# Patient Record
Sex: Female | Born: 1970 | Race: Black or African American | Hispanic: No | Marital: Married | Smoking: Former smoker
Health system: Southern US, Community
[De-identification: ages and names within clinical notes are randomized; demographics above are authoritative.]

## PROBLEM LIST (undated history)

## (undated) DIAGNOSIS — I10 Essential (primary) hypertension: Secondary | ICD-10-CM

## (undated) DIAGNOSIS — R002 Palpitations: Secondary | ICD-10-CM

---

## 1998-01-01 ENCOUNTER — Other Ambulatory Visit: Admission: RE | Admit: 1998-01-01 | Discharge: 1998-01-01 | Payer: Self-pay | Admitting: Obstetrics and Gynecology

## 1998-02-12 ENCOUNTER — Other Ambulatory Visit: Admission: RE | Admit: 1998-02-12 | Discharge: 1998-02-12 | Payer: Self-pay | Admitting: Obstetrics and Gynecology

## 1998-03-04 ENCOUNTER — Inpatient Hospital Stay (HOSPITAL_COMMUNITY): Admission: AD | Admit: 1998-03-04 | Discharge: 1998-03-04 | Payer: Self-pay | Admitting: Obstetrics and Gynecology

## 1998-03-23 ENCOUNTER — Inpatient Hospital Stay (HOSPITAL_COMMUNITY): Admission: AD | Admit: 1998-03-23 | Discharge: 1998-03-23 | Payer: Self-pay | Admitting: *Deleted

## 1998-03-24 ENCOUNTER — Inpatient Hospital Stay (HOSPITAL_COMMUNITY): Admission: AD | Admit: 1998-03-24 | Discharge: 1998-03-24 | Payer: Self-pay | Admitting: Obstetrics and Gynecology

## 1998-03-31 ENCOUNTER — Observation Stay (HOSPITAL_COMMUNITY): Admission: AD | Admit: 1998-03-31 | Discharge: 1998-03-31 | Payer: Self-pay | Admitting: Obstetrics and Gynecology

## 1998-03-31 ENCOUNTER — Inpatient Hospital Stay (HOSPITAL_COMMUNITY): Admission: AD | Admit: 1998-03-31 | Discharge: 1998-04-02 | Payer: Self-pay | Admitting: Obstetrics and Gynecology

## 1998-05-21 ENCOUNTER — Other Ambulatory Visit: Admission: RE | Admit: 1998-05-21 | Discharge: 1998-05-21 | Payer: Self-pay | Admitting: Obstetrics and Gynecology

## 2007-05-26 ENCOUNTER — Emergency Department (HOSPITAL_COMMUNITY): Admission: EM | Admit: 2007-05-26 | Discharge: 2007-05-26 | Payer: Self-pay | Admitting: Emergency Medicine

## 2010-01-02 ENCOUNTER — Emergency Department (HOSPITAL_COMMUNITY): Admission: EM | Admit: 2010-01-02 | Discharge: 2010-01-02 | Payer: Self-pay | Admitting: Emergency Medicine

## 2010-01-03 ENCOUNTER — Emergency Department (HOSPITAL_COMMUNITY): Admission: EM | Admit: 2010-01-03 | Discharge: 2010-01-03 | Payer: Self-pay | Admitting: Family Medicine

## 2010-07-15 ENCOUNTER — Emergency Department (HOSPITAL_COMMUNITY)
Admission: EM | Admit: 2010-07-15 | Discharge: 2010-07-16 | Payer: Self-pay | Source: Home / Self Care | Admitting: Emergency Medicine

## 2010-10-28 LAB — BASIC METABOLIC PANEL
Calcium: 9.4 mg/dL (ref 8.4–10.5)
GFR calc Af Amer: >60 mL/min (ref >60)
GFR calc non Af Amer: >60 mL/min (ref >60)
Glucose, Bld: 81 mg/dL (ref 70–99)
Potassium: 3.8 mEq/L (ref 3.5–5.1)
Sodium: 136 mEq/L (ref 135–145)

## 2010-10-28 LAB — POCT I-STAT, CHEM 8
BUN: 12 mg/dL (ref 6–23)
Hemoglobin: 12.9 g/dL (ref 12.0–15.0)
Potassium: 3.7 mEq/L (ref 3.5–5.1)
Sodium: 137 mEq/L (ref 135–145)
TCO2: 24 mmol/L (ref 0–100)

## 2010-10-28 LAB — CBC
HCT: 35 % — ABNORMAL LOW (ref 36.0–46.0)
Hemoglobin: 10.8 g/dL — ABNORMAL LOW (ref 12.0–15.0)
MCHC: 30.9 g/dL (ref 30.0–36.0)
RBC: 4.12 MIL/uL (ref 3.87–5.11)
WBC: 7.4 10*3/uL (ref 4.0–10.5)

## 2010-10-28 LAB — URINALYSIS, ROUTINE W REFLEX MICROSCOPIC
Glucose, UA: NEGATIVE mg/dL
Ketones, ur: NEGATIVE mg/dL
Nitrite: NEGATIVE
Specific Gravity, Urine: 1.009 (ref 1.005–1.030)
pH: 7 (ref 5.0–8.0)

## 2010-10-28 LAB — DIFFERENTIAL
Basophils Absolute: 0.1 10*3/uL (ref 0.0–0.1)
Basophils Relative: 1 % (ref 0–1)
Lymphocytes Relative: 26 % (ref 12–46)
Monocytes Absolute: 0.6 10*3/uL (ref 0.1–1.0)
Monocytes Relative: 8 % (ref 3–12)
Neutro Abs: 4.8 10*3/uL (ref 1.7–7.7)
Neutrophils Relative %: 65 % (ref 43–77)

## 2010-10-28 LAB — WET PREP, GENITAL
Clue Cells Wet Prep HPF POC: NONE SEEN
Trich, Wet Prep: NONE SEEN
Yeast Wet Prep HPF POC: NONE SEEN

## 2010-10-28 LAB — LACTIC ACID, PLASMA: Lactic Acid, Venous: 1.6 mmol/L (ref 0.5–2.2)

## 2010-10-28 LAB — POCT PREGNANCY, URINE: Preg Test, Ur: NEGATIVE

## 2010-11-03 LAB — COMPREHENSIVE METABOLIC PANEL
Albumin: 3.9 g/dL (ref 3.5–5.2)
BUN: 11 mg/dL (ref 6–23)
Calcium: 8.9 mg/dL (ref 8.4–10.5)
Creatinine, Ser: 0.74 mg/dL (ref 0.4–1.2)
Total Bilirubin: 1.1 mg/dL (ref 0.3–1.2)
Total Protein: 6.7 g/dL (ref 6.0–8.3)

## 2010-11-03 LAB — TSH: TSH: 0.511 u[IU]/mL (ref 0.350–4.500)

## 2010-11-03 LAB — DIFFERENTIAL
Basophils Absolute: 0.1 10*3/uL (ref 0.0–0.1)
Basophils Relative: 2 % — ABNORMAL HIGH (ref 0–1)
Eosinophils Absolute: 0 10*3/uL (ref 0.0–0.7)
Eosinophils Relative: 1 % (ref 0–5)
Monocytes Absolute: 0.4 10*3/uL (ref 0.1–1.0)

## 2010-11-03 LAB — CBC
HCT: 32.1 % — ABNORMAL LOW (ref 36.0–46.0)
Hemoglobin: 11 g/dL — ABNORMAL LOW (ref 12.0–15.0)
MCHC: 34.1 g/dL (ref 30.0–36.0)
Platelets: 229 10*3/uL (ref 150–400)
RDW: 13.6 % (ref 11.5–15.5)

## 2010-11-03 LAB — PROTIME-INR: INR: 1.05 (ref 0.00–1.49)

## 2010-11-03 LAB — APTT: aPTT: 32 seconds (ref 24–37)

## 2011-03-31 ENCOUNTER — Inpatient Hospital Stay (INDEPENDENT_AMBULATORY_CARE_PROVIDER_SITE_OTHER)
Admission: RE | Admit: 2011-03-31 | Discharge: 2011-03-31 | Disposition: A | Discharge status: Home / Self Care | Payer: Self-pay | Source: Ambulatory Visit | Attending: Family Medicine | Admitting: Family Medicine

## 2011-03-31 DIAGNOSIS — N760 Acute vaginitis: Secondary | ICD-10-CM

## 2011-03-31 LAB — POCT URINALYSIS DIP (DEVICE)
Bilirubin Urine: NEGATIVE
Glucose, UA: NEGATIVE mg/dL
Leukocytes, UA: NEGATIVE
Nitrite: POSITIVE — AB
pH: 7.5 (ref 5.0–8.0)

## 2011-03-31 LAB — WET PREP, GENITAL: Trich, Wet Prep: NONE SEEN

## 2011-04-02 LAB — URINE CULTURE: Culture  Setup Time: 201208141617

## 2011-04-02 LAB — GC/CHLAMYDIA PROBE AMP, GENITAL: Chlamydia, DNA Probe: NEGATIVE

## 2011-05-28 LAB — I-STAT 8, (EC8 V) (CONVERTED LAB)
Acid-Base Excess: 1
BUN: 9
Bicarbonate: 26.5 — ABNORMAL HIGH
HCT: 37
Hemoglobin: 12.6
Operator id: 198171
Sodium: 137
TCO2: 28
pCO2, Ven: 45.9

## 2011-05-28 LAB — URINALYSIS, ROUTINE W REFLEX MICROSCOPIC
Glucose, UA: NEGATIVE
Ketones, ur: NEGATIVE
Nitrite: NEGATIVE
Protein, ur: NEGATIVE
Urobilinogen, UA: 0.2

## 2011-05-28 LAB — POCT CARDIAC MARKERS
CKMB, poc: <1 — ABNORMAL LOW
Troponin i, poc: <0.05

## 2011-05-28 LAB — POCT PREGNANCY, URINE
Operator id: 198171
Preg Test, Ur: NEGATIVE

## 2011-11-19 ENCOUNTER — Encounter (HOSPITAL_COMMUNITY): Payer: Self-pay

## 2011-11-19 ENCOUNTER — Emergency Department (HOSPITAL_COMMUNITY)
Admission: EM | Admit: 2011-11-19 | Discharge: 2011-11-19 | Disposition: A | Discharge status: Home / Self Care | Payer: Self-pay | Attending: Emergency Medicine | Admitting: Emergency Medicine

## 2011-11-19 ENCOUNTER — Emergency Department (HOSPITAL_COMMUNITY): Payer: Self-pay

## 2011-11-19 DIAGNOSIS — N63 Unspecified lump in unspecified breast: Secondary | ICD-10-CM | POA: Insufficient documentation

## 2011-11-19 DIAGNOSIS — R079 Chest pain, unspecified: Secondary | ICD-10-CM | POA: Insufficient documentation

## 2011-11-19 LAB — CBC
HCT: 31.8 % — ABNORMAL LOW (ref 36.0–46.0)
MCHC: 31.4 g/dL (ref 30.0–36.0)
Platelets: 281 10*3/uL (ref 150–400)
RDW: 14.2 % (ref 11.5–15.5)
WBC: 4.6 10*3/uL (ref 4.0–10.5)

## 2011-11-19 LAB — BASIC METABOLIC PANEL
BUN: 15 mg/dL (ref 6–23)
Creatinine, Ser: 0.7 mg/dL (ref 0.50–1.10)
GFR calc Af Amer: >90 mL/min (ref >90)
GFR calc non Af Amer: >90 mL/min (ref >90)
Potassium: 3.7 mEq/L (ref 3.5–5.1)

## 2011-11-19 MED ORDER — OXYCODONE-ACETAMINOPHEN 5-325 MG PO TABS
1.0000 | ORAL_TABLET | Freq: Once | ORAL | Status: DC
  Filled 2011-11-19: qty 1

## 2011-11-19 NOTE — ED Notes (Signed)
Patient states that she had onset of CP last night while laying in bed. Pt states that pain improved on its own.  Pt states that today she had another episode of CP.  Pt states that CP comes and goes at this time.  Pt denies any Cp at this time.  Pt denies any SOB.  Pt did have an episode of nausea with same since her arrival to ED. Pt denies SOB.  PT denies anything that increases pain at this time.  Lungs are clear.  Patient does state that she had had a cough for the past several weeks. Cough is non productive at this time.

## 2011-11-19 NOTE — Discharge Instructions (Signed)
Chest Pain (Nonspecific) It is often hard to give a specific diagnosis for the cause of chest pain. There is always a chance that your pain could be related to something serious, such as a heart attack or a blood clot in the lungs. You need to follow up with your caregiver for further evaluation. CAUSES   Heartburn.   Pneumonia or bronchitis.   Anxiety or stress.   Inflammation around your heart (pericarditis) or lung (pleuritis or pleurisy).   A blood clot in the lung.   A collapsed lung (pneumothorax). It can develop suddenly on its own (spontaneous pneumothorax) or from injury (trauma) to the chest.   Shingles infection (herpes zoster virus).  The chest wall is composed of bones, muscles, and cartilage. Any of these can be the source of the pain.  The bones can be bruised by injury.   The muscles or cartilage can be strained by coughing or overwork.   The cartilage can be affected by inflammation and become sore (costochondritis).  DIAGNOSIS  Lab tests or other studies, such as X-rays, electrocardiography, stress testing, or cardiac imaging, may be needed to find the cause of your pain.  TREATMENT   Treatment depends on what may be causing your chest pain. Treatment may include:   Acid blockers for heartburn.   Anti-inflammatory medicine.   Pain medicine for inflammatory conditions.   Antibiotics if an infection is present.   You may be advised to change lifestyle habits. This includes stopping smoking and avoiding alcohol, caffeine, and chocolate.   You may be advised to keep your head raised (elevated) when sleeping. This reduces the chance of acid going backward from your stomach into your esophagus.   Most of the time, nonspecific chest pain will improve within 2 to 3 days with rest and mild pain medicine.  HOME CARE INSTRUCTIONS   If antibiotics were prescribed, take your antibiotics as directed. Finish them even if you start to feel better.   For the next few  days, avoid physical activities that bring on chest pain. Continue physical activities as directed.   Do not smoke.   Avoid drinking alcohol.   Only take over-the-counter or prescription medicine for pain, discomfort, or fever as directed by your caregiver.   Follow your caregiver's suggestions for further testing if your chest pain does not go away.   Keep any follow-up appointments you made. If you do not go to an appointment, you could develop lasting (chronic) problems with pain. If there is any problem keeping an appointment, you must call to reschedule.  SEEK MEDICAL CARE IF:   You think you are having problems from the medicine you are taking. Read your medicine instructions carefully.   Your chest pain does not go away, even after treatment.   You develop a rash with blisters on your chest.  SEEK IMMEDIATE MEDICAL CARE IF:   You have increased chest pain or pain that spreads to your arm, neck, jaw, back, or abdomen.   You develop shortness of breath, an increasing cough, or you are coughing up blood.   You have severe back or abdominal pain, feel nauseous, or vomit.   You develop severe weakness, fainting, or chills.   You have a fever.  THIS IS AN EMERGENCY. Do not wait to see if the pain will go away. Get medical help at once. Call your local emergency services (911 in [place] Do not drive yourself to the hospital. MAKE SURE YOU:   Understand these instructions.     Will watch your condition.   Will get help right away if you are not doing well or get worse.  Document Released: 05/13/2005 Document Revised: 07/23/2011 Document Reviewed: 03/08/2008 [employer] Patient Information 2012 [employer]. 

## 2011-11-19 NOTE — ED Notes (Signed)
Pt presents with L sided chest pain that began last night.  Pt reports she was lying on her L side, changed position that relieved pain somewhat to where she could go to sleep.  Pt reports pain began again while she was at work.  Pt denies any shortness of breath, nausea, but reports 2 day h/o migraine headache.

## 2011-11-20 NOTE — ED Provider Notes (Signed)
History     CSN: [id number]  Arrival date & time 11/19/11  1220   First MD Initiated Contact with Patient 11/19/11 1330      Chief Complaint  Patient presents with  . Chest Pain     HPI Pt reports development of left sided chest pain without n/v, SOB or diaphoresis. Worse when lying on her left side. Improved by movement. No unilateral leg swelling. No hx of DVT or PE. No hx of CAD or ACS. Also noted a new left sided breast mass last night. No skin changes or nipple dischargeNo weight loss. No hx of cancer. Occasional HA's including now. No unilateral arm or leg weakness. symtoms are mild severity  History reviewed. No pertinent past medical history.  History reviewed. No pertinent past surgical history.  Family Hx: no early cardiac dz, otherwise noncontributory  History  Substance Use Topics  . Smoking status: Never Smoker   . Smokeless tobacco: Not on file  . Alcohol Use: Yes    OB History    Grav Para Term Preterm Abortions TAB SAB Ect Mult Living                  Review of Systems  All other systems reviewed and are negative.    Allergies  Review of patient's allergies indicates no known allergies.  Home Medications   Current Outpatient Rx  Name Route Sig Dispense Refill  . THERA M PLUS PO TABS Oral Take 1 tablet by mouth daily.    Marland Kitchen PYRIDOXINE HCL 25 MG PO TABS Oral Take 25 mg by mouth daily.    Marland Kitchen VITAMIN C 500 MG PO TABS Oral Take 500 mg by mouth daily.      BP 133/86  Pulse 76  Temp(Src) 98.6 F (37 C) (Oral)  Resp 17  SpO2 99%  Physical Exam  Nursing note and vitals reviewed. Constitutional: She is oriented to person, place, and time. She appears well-developed and well-nourished. No distress.  HENT:  Head: Normocephalic and atraumatic.  Eyes: EOM are normal.  Neck: Normal range of motion.  Cardiovascular: Normal rate, regular rhythm and normal heart sounds.   Pulmonary/Chest: Effort normal and breath sounds normal.       Mild tenderness of  left anterior chest wall. Right breast normal in all 4 quadrants. Left breast with mild deep linear mass noted in right upper quadrant of breast. Normal nipple without discharge. No overlying skin changes. (chaperone present)  Abdominal: Soft. She exhibits no distension. There is no tenderness.  Musculoskeletal: Normal range of motion.  Neurological: She is alert and oriented to person, place, and time.  Skin: Skin is warm and dry.  Psychiatric: She has a normal mood and affect. Judgment normal.    ED Course  Procedures (including critical care time)  Labs Reviewed  CBC - Abnormal; Notable for the following:    RBC 3.82 (*)    Hemoglobin 10.0 (*)    HCT 31.8 (*)    All other components within normal limits  BASIC METABOLIC PANEL - Abnormal; Notable for the following:    Sodium 134 (*)    All other components within normal limits   Dg Chest 2 View  11/19/2011  *RADIOLOGY REPORT*  Clinical Data: Lambert Mody left side chest pain today  CHEST - 2 VIEW  Comparison: 05/26/2007  Findings: Upper-normal size of cardiac silhouette. Mediastinal contours and pulmonary vascularity normal. Lungs clear. No pleural effusion or pneumothorax. No acute osseous findings.  IMPRESSION: No acute abnormalities.  Original Report Authenticated By: Lollie Marrow, M.D.    Date: 11/21/2011  Rate: 84  Rhythm: normal sinus rhythm  QRS Axis: normal  Intervals: normal  ST/T Wave abnormalities: normal  Conduction Disutrbances: none  Narrative Interpretation:   Old EKG Reviewed: No significant changes noted     1. Chest pain   2. Breast mass       MDM  Mild linear mass noted in left breast. Likely fibrocystic but has been referred to the breast center for further evaluation and imaging. Pt to make appointment and understands importance. Chest pain is musculoskeletal. Doubt PE. Doubt ACS. ecg and cxr normal        Lyanne Co, MD 11/21/11 1046

## 2012-03-17 ENCOUNTER — Encounter (HOSPITAL_COMMUNITY): Payer: Self-pay

## 2012-03-17 ENCOUNTER — Emergency Department (INDEPENDENT_AMBULATORY_CARE_PROVIDER_SITE_OTHER)
Admission: EM | Admit: 2012-03-17 | Discharge: 2012-03-17 | Disposition: A | Discharge status: Home / Self Care | Payer: Self-pay | Source: Home / Self Care | Attending: Emergency Medicine | Admitting: Emergency Medicine

## 2012-03-17 DIAGNOSIS — J069 Acute upper respiratory infection, unspecified: Secondary | ICD-10-CM

## 2012-03-17 MED ORDER — AZITHROMYCIN 250 MG PO TABS: ORAL_TABLET | ORAL | Status: AC

## 2012-03-17 MED ORDER — FEXOFENADINE-PSEUDOEPHED ER 60-120 MG PO TB12: 1.0000 | ORAL_TABLET | Freq: Two times a day (BID) | ORAL | Status: AC

## 2012-03-17 MED ORDER — BENZONATATE 200 MG PO CAPS: 200.0000 mg | ORAL_CAPSULE | Freq: Three times a day (TID) | ORAL | Status: AC | PRN

## 2012-03-17 NOTE — ED Notes (Signed)
Cough, congestion; yellow secretions

## 2012-03-17 NOTE — ED Provider Notes (Signed)
Chief Complaint  Patient presents with  . URI    History of Present Illness:   Betty Schmidt is a 41 year old female who has had a 2-3 day history of nasal congestion with clear to yellow rhinorrhea, sinus pressure, sneezing, cough productive yellow sputum, ear ache, scratchy throat, hoarseness, chills, and diarrhea. She denies any wheezing, chest tightness, shortness of breath, nausea, vomiting, or fever.  Review of Systems:  Other than noted above, the patient denies any of the following symptoms. Systemic:  No fever, chills, sweats, fatigue, myalgias, headache, or anorexia. Eye:  No redness, pain or drainage. ENT:  No earache, ear congestion, nasal congestion, sneezing, rhinorrhea, sinus pressure, sinus pain, post nasal drip, or sore throat. Lungs:  No cough, sputum production, wheezing, shortness of breath, or chest pain. GI:  No abdominal pain, nausea, vomiting, or diarrhea. Skin:  No rash or itching.  PMFSH:  Past medical history, family history, social history, meds, and allergies were reviewed.  Physical Exam:   Vital signs:  BP 130/87  Pulse 106  Temp 98.7 F (37.1 C) (Oral)  Resp 18  SpO2 100% General:  Alert, in no distress. Eye:  No conjunctival injection or drainage. Lids were normal. ENT:  TMs and canals were normal, without erythema or inflammation.  Nasal mucosa was congested, without drainage.  Mucous membranes were moist.  Pharynx was clear, without exudate or drainage.  There were no oral ulcerations or lesions. Neck:  Supple, no adenopathy, tenderness or mass. Lungs:  No respiratory distress.  Lungs were clear to auscultation, without wheezes, rales or rhonchi.  Breath sounds were clear and equal bilaterally. Lungs were resonant to percussion.  No egophony. Heart:  Regular rhythm, without gallops, murmers or rubs. Skin:  Clear, warm, and dry, without rash or lesions.  Assessment:  The encounter diagnosis was Viral upper respiratory infection.  Plan:   1.  The  following meds were prescribed:   New Prescriptions   AZITHROMYCIN (ZITHROMAX Z-PAK) 250 MG TABLET    Take as directed.   BENZONATATE (TESSALON) 200 MG CAPSULE    Take 1 capsule (200 mg total) by mouth 3 (three) times daily as needed for cough.   FEXOFENADINE-PSEUDOEPHEDRINE (ALLEGRA-D) 60-120 MG PER TABLET    Take 1 tablet by mouth every 12 (twelve) hours.   2.  The patient was instructed in symptomatic care and handouts were given. 3.  The patient was told to return if becoming worse in any way, if no better in 3 or 4 days, and given some red flag symptoms that would indicate earlier return.  The patient was told not to get the prescription for antibiotic filled unless her respiratory symptoms had persisted for more than 7 to 10 days.     Reuben Likes, MD 03/17/12 [phone]

## 2013-06-07 ENCOUNTER — Encounter (HOSPITAL_COMMUNITY): Payer: Self-pay | Admitting: Emergency Medicine

## 2013-06-07 ENCOUNTER — Emergency Department (HOSPITAL_COMMUNITY)
Admission: EM | Admit: 2013-06-07 | Discharge: 2013-06-07 | Disposition: A | Discharge status: Home / Self Care | Payer: Self-pay | Attending: Emergency Medicine | Admitting: Emergency Medicine

## 2013-06-07 DIAGNOSIS — I10 Essential (primary) hypertension: Secondary | ICD-10-CM | POA: Insufficient documentation

## 2013-06-07 DIAGNOSIS — R42 Dizziness and giddiness: Secondary | ICD-10-CM | POA: Insufficient documentation

## 2013-06-07 DIAGNOSIS — Z3202 Encounter for pregnancy test, result negative: Secondary | ICD-10-CM | POA: Insufficient documentation

## 2013-06-07 DIAGNOSIS — Z87891 Personal history of nicotine dependence: Secondary | ICD-10-CM | POA: Insufficient documentation

## 2013-06-07 HISTORY — DX: Palpitations: R00.2

## 2013-06-07 HISTORY — DX: Essential (primary) hypertension: I10

## 2013-06-07 LAB — CBC WITH DIFFERENTIAL/PLATELET
Basophils Absolute: 0.1 10*3/uL (ref 0.0–0.1)
Basophils Relative: 1 % (ref 0–1)
Hemoglobin: 10.5 g/dL — ABNORMAL LOW (ref 12.0–15.0)
Lymphocytes Relative: 34 % (ref 12–46)
MCHC: 32 g/dL (ref 30.0–36.0)
Neutro Abs: 2.1 10*3/uL (ref 1.7–7.7)
Neutrophils Relative %: 51 % (ref 43–77)
RDW: 14.8 % (ref 11.5–15.5)
WBC: 4 10*3/uL (ref 4.0–10.5)

## 2013-06-07 LAB — BASIC METABOLIC PANEL
Chloride: 101 mEq/L (ref 96–112)
GFR calc Af Amer: >90 mL/min (ref >90)
Potassium: 3.9 mEq/L (ref 3.5–5.1)

## 2013-06-07 LAB — PREGNANCY, URINE: Preg Test, Ur: NEGATIVE

## 2013-06-07 MED ORDER — MECLIZINE HCL 25 MG PO TABS: 25.0000 mg | ORAL_TABLET | Freq: Three times a day (TID) | ORAL | Status: AC | PRN

## 2013-06-07 NOTE — Progress Notes (Signed)
[facility] provided pt with a list of primary care resources and a [facility] Card application.  °

## 2013-06-07 NOTE — ED Notes (Signed)
C/o dizzy spells for one week, yesterday felt as if going to pass, balance unsteady, nausea, no vomiting

## 2013-06-07 NOTE — ED Provider Notes (Signed)
CSN: [id number]     Arrival date & time 06/07/13  1155 History   First MD Initiated Contact with Patient 06/07/13 1500     Chief Complaint  Patient presents with  . Dizziness    The history is provided by the patient.  Pt has felt dizzy for a couple of weeks.  She noticed it after her menstrual cycle occurred twice in the last month.  Yesterday the symptoms became worse.  No  room spinning but the room does rock.  She does feel lightheaded.    When she suddenly stands up she notices the symptoms more.  No vomiting.  No earache.  SHe has noticed some ringing in her ears off and on. No trouble with speech.  It has been pretty constant now.  She has had some recent sinus congestion.  Past Medical History  Diagnosis Date  . Hypertension   . Heart palpitations    History reviewed. No pertinent past surgical history. History reviewed. No pertinent family history. History  Substance Use Topics  . Smoking status: Former Games developer  . Smokeless tobacco: Not on file  . Alcohol Use: No   OB History   Grav Para Term Preterm Abortions TAB SAB Ect Mult Living                 Review of Systems  Constitutional: Negative for fever.  HENT: Negative for ear discharge and ear pain.   Gastrointestinal: Negative for blood in stool and anal bleeding.  Neurological: Positive for dizziness. Negative for syncope, speech difficulty, weakness, numbness and headaches.  All other systems reviewed and are negative.    Allergies  Oxycodone-acetaminophen  Home Medications   Current Outpatient Rx  Name  Route  Sig  Dispense  Refill  . ibuprofen (ADVIL,MOTRIN) 200 MG tablet   Oral   Take 400-800 mg by mouth every 6 (six) hours as needed for pain.         . meclizine (ANTIVERT) 25 MG tablet   Oral   Take 1 tablet (25 mg total) by mouth 3 (three) times daily as needed for dizziness or nausea.   30 tablet   0    BP 147/79  Pulse 69  Temp(Src) 98.1 F (36.7 C) (Oral)  Resp 14  SpO2 100% Physical  Exam  Nursing note and vitals reviewed. Constitutional: She is oriented to person, place, and time. She appears well-developed and well-nourished. No distress.  HENT:  Head: Normocephalic and atraumatic.  Right Ear: External ear normal.  Left Ear: External ear normal.  Mouth/Throat: Oropharynx is clear and moist.  Eyes: Conjunctivae are normal. Right eye exhibits no discharge. Left eye exhibits no discharge. No scleral icterus.  Neck: Neck supple. No tracheal deviation present.  Cardiovascular: Normal rate, regular rhythm and intact distal pulses.   Pulmonary/Chest: Effort normal and breath sounds normal. No stridor. No respiratory distress. She has no wheezes. She has no rales.  Abdominal: Soft. Bowel sounds are normal. She exhibits no distension. There is no tenderness. There is no rebound and no guarding.  Musculoskeletal: She exhibits no edema and no tenderness.  Neurological: She is alert and oriented to person, place, and time. She has normal strength. No cranial nerve deficit ( no gross defecits noted) or sensory deficit. She exhibits normal muscle tone. She displays no seizure activity. Coordination normal.  No pronator drift bilateral upper extrem, able to hold both legs off bed for 5 seconds, sensation intact in all extremities, no visual field cuts, no left  or right sided neglect  Skin: Skin is warm and dry. No rash noted.  Psychiatric: She has a normal mood and affect.    ED Course  Procedures (including critical care time) Labs Review Labs Reviewed  CBC WITH DIFFERENTIAL - Abnormal; Notable for the following:    Hemoglobin 10.5 (*)    HCT 32.8 (*)    All other components within normal limits  BASIC METABOLIC PANEL - Abnormal; Notable for the following:    Sodium 134 (*)    GFR calc non Af Amer 83 (*)    All other components within normal limits  PREGNANCY, URINE   Imaging Review No results found.  EKG Interpretation   None       MDM   1. Vertigo       Patient has a mild anemia but this is similar to laboratory tests performed a few years ago. I doubt this is related to her symptoms. The patient's symptoms are not classic for peripheral vertigo but her symptoms are rather mild. She does admit a component of recent sinus congestion and some complaints of wobbling. Patient has a normal neurologic exam. I doubt stroke or other serious neurologic etiology. She'll be discharged home on a trial of meclizine and I recommend follow up with her primary Dr. if the symptoms persist.   the patient understands to return for worsening symptoms difficulty with her balance or speech   Celene Kras, MD 06/07/13 [phone]

## 2014-12-11 ENCOUNTER — Encounter (HOSPITAL_COMMUNITY): Payer: Self-pay | Admitting: Emergency Medicine

## 2014-12-11 ENCOUNTER — Emergency Department (HOSPITAL_COMMUNITY)
Admission: EM | Admit: 2014-12-11 | Discharge: 2014-12-11 | Disposition: A | Discharge status: Home / Self Care | Payer: 59 | Source: Home / Self Care | Attending: Family Medicine | Admitting: Family Medicine

## 2014-12-11 DIAGNOSIS — N939 Abnormal uterine and vaginal bleeding, unspecified: Secondary | ICD-10-CM | POA: Diagnosis not present

## 2014-12-11 LAB — POCT I-STAT, CHEM 8
BUN: 15 mg/dL (ref 6–23)
CHLORIDE: 103 mmol/L (ref 96–112)
Calcium, Ion: 1.23 mmol/L (ref 1.12–1.23)
Creatinine, Ser: 0.8 mg/dL (ref 0.50–1.10)
Glucose, Bld: 89 mg/dL (ref 70–99)
HCT: 31 % — ABNORMAL LOW (ref 36.0–46.0)
Hemoglobin: 10.5 g/dL — ABNORMAL LOW (ref 12.0–15.0)
POTASSIUM: 3.7 mmol/L (ref 3.5–5.1)
Sodium: 140 mmol/L (ref 135–145)
TCO2: 23 mmol/L (ref 0–100)

## 2014-12-11 MED ORDER — NORGESTIMATE-ETH ESTRADIOL 0.25-35 MG-MCG PO TABS: 1.0000 | ORAL_TABLET | Freq: Every day | ORAL | Status: AC

## 2014-12-11 NOTE — ED Provider Notes (Signed)
Betty Schmidt is a 44 y.o. female who presents to Urgent Care today for heavy vaginal bleeding for the last 2 weeks. This is not typical for the patient. She is using multiple pads per day. No abdominal pain fevers chills nausea vomiting or diarrhea. No treatment tried yet. No lightheadedness or dizziness.   Past Medical History  Diagnosis Date  . Hypertension   . Heart palpitations    History reviewed. No pertinent past surgical history. History  Substance Use Topics  . Smoking status: Former Research scientist (life sciences)  . Smokeless tobacco: Not on file  . Alcohol Use: No   ROS as above Medications: No current facility-administered medications for this encounter.   Current Outpatient Prescriptions  Medication Sig Dispense Refill  . ibuprofen (ADVIL,MOTRIN) 200 MG tablet Take 400-800 mg by mouth every 6 (six) hours as needed for pain.    . meclizine (ANTIVERT) 25 MG tablet Take 1 tablet (25 mg total) by mouth 3 (three) times daily as needed for dizziness or nausea. 30 tablet 0  . norgestimate-ethinyl estradiol (SPRINTEC 28) 0.25-35 MG-MCG tablet Take 1 tablet by mouth daily. 1 Package 0   Allergies  Allergen Reactions  . Oxycodone-Acetaminophen     Vomiting      Exam:  BP 156/107 mmHg  Pulse 86  Temp(Src) 98.2 F (36.8 C) (Oral)  Resp 24  SpO2 100%  LMP 11/27/2014 (Exact Date) Gen: Well NAD HEENT: EOMI,  MMM Lungs: Normal work of breathing. CTABL Heart: RRR no MRG Abd: NABS, Soft. Nondistended, Nontender no masses palpated Exts: Brisk capillary refill, warm and well perfused.   Results for orders placed or performed during the hospital encounter of 12/11/14 (from the past 24 hour(s))  I-STAT, chem 8     Status: Abnormal   Collection Time: 12/11/14  7:47 PM  Result Value Ref Range   Sodium 140 135 - 145 mmol/L   Potassium 3.7 3.5 - 5.1 mmol/L   Chloride 103 96 - 112 mmol/L   BUN 15 6 - 23 mg/dL   Creatinine, Ser 0.80 0.50 - 1.10 mg/dL   Glucose, Bld 89 70 - 99 mg/dL   Calcium,  Ion 1.23 1.12 - 1.23 mmol/L   TCO2 23 0 - 100 mmol/L   Hemoglobin 10.5 (L) 12.0 - 15.0 g/dL   HCT 31.0 (L) 36.0 - 46.0 %   No results found.  Assessment and Plan: 44 y.o. female with heavy bleeding. Start Sprintec follow-up with OB/GYN. Patient will require a ultrasound.  Discussed warning signs or symptoms. Please see discharge instructions. Patient expresses understanding.     Gregor Hams, MD 12/11/14 2006

## 2014-12-11 NOTE — ED Notes (Signed)
Pt states she has been having an irregular period this month with excessive bleeding and clotting.  She states she starts to get cramps then she has the blood clots that she says "pushes her tampon out".

## 2014-12-11 NOTE — Discharge Instructions (Signed)
Thank you for coming in today. Start Sprintec birth control pill daily. Follow-up with OB/GYN ASAP.   Abnormal Uterine Bleeding Abnormal uterine bleeding can affect women at various stages in life, including teenagers, women in their reproductive years, pregnant women, and women who have reached menopause. Several kinds of uterine bleeding are considered abnormal, including:  Bleeding or spotting between periods.   Bleeding after sexual intercourse.   Bleeding that is heavier or more than normal.   Periods that last longer than usual.  Bleeding after menopause.  Many cases of abnormal uterine bleeding are minor and simple to treat, while others are more serious. Any type of abnormal bleeding should be evaluated by your health care provider. Treatment will depend on the cause of the bleeding. HOME CARE INSTRUCTIONS Monitor your condition for any changes. The following actions may help to alleviate any discomfort you are experiencing:  Avoid the use of tampons and douches as directed by your health care provider.  Change your pads frequently. You should get regular pelvic exams and Pap tests. Keep all follow-up appointments for diagnostic tests as directed by your health care provider.  SEEK MEDICAL CARE IF:   Your bleeding lasts more than 1 week.   You feel dizzy at times.  SEEK IMMEDIATE MEDICAL CARE IF:   You pass out.   You are changing pads every 15 to 30 minutes.   You have abdominal pain.  You have a fever.   You become sweaty or weak.   You are passing large blood clots from the vagina.   You start to feel nauseous and vomit. MAKE SURE YOU:   Understand these instructions.  Will watch your condition.  Will get help right away if you are not doing well or get worse. Document Released: 08/03/2005 Document Revised: 08/08/2013 Document Reviewed: 03/02/2013 [facility] Patient Information 2015 [place], [place]. This information is not intended to replace  advice given to you by your health care provider. Make sure you discuss any questions you have with your health care provider.

## 2017-03-31 DIAGNOSIS — K219 Gastro-esophageal reflux disease without esophagitis: Secondary | ICD-10-CM | POA: Insufficient documentation

## 2017-03-31 DIAGNOSIS — I10 Essential (primary) hypertension: Secondary | ICD-10-CM | POA: Insufficient documentation

## 2018-12-11 ENCOUNTER — Encounter (HOSPITAL_COMMUNITY): Payer: Self-pay | Admitting: *Deleted

## 2018-12-11 ENCOUNTER — Emergency Department (HOSPITAL_COMMUNITY)
Admission: EM | Admit: 2018-12-11 | Discharge: 2018-12-11 | Disposition: A | Discharge status: Home / Self Care | Payer: 59 | Attending: Emergency Medicine | Admitting: Emergency Medicine

## 2018-12-11 DIAGNOSIS — Z79899 Other long term (current) drug therapy: Secondary | ICD-10-CM | POA: Diagnosis not present

## 2018-12-11 DIAGNOSIS — Z202 Contact with and (suspected) exposure to infections with a predominantly sexual mode of transmission: Secondary | ICD-10-CM | POA: Insufficient documentation

## 2018-12-11 DIAGNOSIS — I10 Essential (primary) hypertension: Secondary | ICD-10-CM | POA: Insufficient documentation

## 2018-12-11 MED ORDER — AZITHROMYCIN 250 MG PO TABS
1000.0000 mg | ORAL_TABLET | Freq: Once | ORAL | Status: AC
  Administered 2018-12-11: 1000 mg via ORAL
  Filled 2018-12-11: qty 4

## 2018-12-11 MED ORDER — METRONIDAZOLE 500 MG PO TABS
2000.0000 mg | ORAL_TABLET | Freq: Once | ORAL | Status: AC
  Administered 2018-12-11: 2000 mg via ORAL
  Filled 2018-12-11: qty 4

## 2018-12-11 MED ORDER — LIDOCAINE HCL 1 % IJ SOLN
INTRAMUSCULAR | Status: AC
  Filled 2018-12-11: qty 20

## 2018-12-11 MED ORDER — CEFTRIAXONE SODIUM 250 MG IJ SOLR
250.0000 mg | Freq: Once | INTRAMUSCULAR | Status: AC
  Administered 2018-12-11: 250 mg via INTRAMUSCULAR
  Filled 2018-12-11: qty 250

## 2018-12-11 NOTE — Discharge Instructions (Addendum)
Refrain from unprotected sexual activity for the next 2 weeks to avoid reinfecting your partner.  Return to the emergency department if symptoms significantly worsen or change.

## 2018-12-11 NOTE — ED Triage Notes (Signed)
Pt reports her fiance has been having symptoms of STD and is concerned she may have STD. Pt denies symptoms at this time.

## 2018-12-11 NOTE — ED Notes (Signed)
Bed: [facility] Expected date:  Expected time:  Means of arrival:  Comments: 

## 2018-12-11 NOTE — ED Provider Notes (Signed)
[facility] DEPT Provider Note   CSN: [id number] Arrival date & time: 12/11/18  1313    History   Chief Complaint Chief Complaint  Patient presents with  . Exposure to STD    HPI Betty Schmidt is a 48 y.o. female.     Patient is a 48 year old female with history of hypertension.  She presents today for possible STD.  She states that her fianc has been having burning with urination for the past several weeks.  She is concerned he may have an STD.  Patient is not currently having any symptoms.  She denies any pelvic pain, dysuria, or discharge.  The history is provided by the patient.  Exposure to STD  This is a new problem. Nothing aggravates the symptoms. Nothing relieves the symptoms.    Past Medical History:  Diagnosis Date  . Heart palpitations   . Hypertension     There are no active problems to display for this patient.   History reviewed. No pertinent surgical history.   OB History   No obstetric history on file.      Home Medications    Prior to Admission medications   Medication Sig Start Date End Date Taking? Authorizing Provider  ibuprofen (ADVIL,MOTRIN) 200 MG tablet Take 400-800 mg by mouth every 6 (six) hours as needed for pain.    [provider]  meclizine (ANTIVERT) 25 MG tablet Take 1 tablet (25 mg total) by mouth 3 (three) times daily as needed for dizziness or nausea. 06/07/13   Dorie Rank, MD  norgestimate-ethinyl estradiol (Lazy Y U 28) 0.25-35 MG-MCG tablet Take 1 tablet by mouth daily. 12/11/14   Gregor Hams, MD    Family History No family history on file.  Social History Social History   Tobacco Use  . Smoking status: Former Research scientist (life sciences)  . Smokeless tobacco: Never Used  Substance Use Topics  . Alcohol use: No  . Drug use: No     Allergies   Oxycodone-acetaminophen   Review of Systems Review of Systems  All other systems reviewed and are negative.    Physical Exam Updated  Vital Signs BP (!) 145/96 (BP Location: Left Arm)   Pulse (!) 107   Temp 99 F (37.2 C) (Oral)   Resp 18   LMP 11/27/2018   SpO2 99%   Physical Exam Vitals signs and nursing note reviewed.  Constitutional:      Appearance: Normal appearance.  HENT:     Head: Normocephalic.  Pulmonary:     Effort: Pulmonary effort is normal.  Skin:    General: Skin is warm and dry.  Neurological:     Mental Status: She is alert.      ED Treatments / Results  Labs (all labs ordered are listed, but only abnormal results are displayed) Labs Reviewed - No data to display  EKG None  Radiology No results found.  Procedures Procedures (including critical care time)  Medications Ordered in ED Medications  cefTRIAXone (ROCEPHIN) injection 250 mg (has no administration in time range)  azithromycin (ZITHROMAX) tablet 1,000 mg (has no administration in time range)  metroNIDAZOLE (FLAGYL) tablet 2,000 mg (has no administration in time range)     Initial Impression / Assessment and Plan / ED Course  I have reviewed the triage vital signs and the nursing notes.  Pertinent labs & imaging results that were available during my care of the patient were reviewed by me and considered in my medical decision making (see chart for  details).  Presents with concerns of an STD.  After discussion, the decision was made to presumptively treat.  She will be given Rocephin, Zithromax, and Flagyl.  To return as needed for any problems.  Final Clinical Impressions(s) / ED Diagnoses   Final diagnoses:  None    ED Discharge Orders    None       Veryl Speak, MD 12/11/18 1355

## 2019-12-04 DIAGNOSIS — N6310 Unspecified lump in the right breast, unspecified quadrant: Secondary | ICD-10-CM | POA: Insufficient documentation

## 2019-12-04 DIAGNOSIS — K5900 Constipation, unspecified: Secondary | ICD-10-CM | POA: Insufficient documentation

## 2019-12-04 DIAGNOSIS — D219 Benign neoplasm of connective and other soft tissue, unspecified: Secondary | ICD-10-CM | POA: Insufficient documentation

## 2019-12-04 DIAGNOSIS — Z6833 Body mass index (BMI) 33.0-33.9, adult: Secondary | ICD-10-CM | POA: Insufficient documentation

## 2019-12-04 DIAGNOSIS — E6609 Other obesity due to excess calories: Secondary | ICD-10-CM | POA: Insufficient documentation

## 2020-10-23 DIAGNOSIS — E559 Vitamin D deficiency, unspecified: Secondary | ICD-10-CM | POA: Insufficient documentation

## 2021-01-17 ENCOUNTER — Institutional Professional Consult (permissible substitution): Payer: 59 | Admitting: Plastic Surgery

## 2021-05-15 ENCOUNTER — Encounter: Payer: Self-pay | Admitting: Podiatry

## 2021-05-15 ENCOUNTER — Other Ambulatory Visit: Payer: Self-pay

## 2021-05-15 ENCOUNTER — Ambulatory Visit (INDEPENDENT_AMBULATORY_CARE_PROVIDER_SITE_OTHER): Payer: Federal, State, Local not specified - PPO | Admitting: Podiatry

## 2021-05-15 ENCOUNTER — Ambulatory Visit (INDEPENDENT_AMBULATORY_CARE_PROVIDER_SITE_OTHER): Payer: Federal, State, Local not specified - PPO

## 2021-05-15 ENCOUNTER — Other Ambulatory Visit: Payer: Self-pay | Admitting: Podiatry

## 2021-05-15 DIAGNOSIS — M722 Plantar fascial fibromatosis: Secondary | ICD-10-CM

## 2021-05-15 DIAGNOSIS — M205X2 Other deformities of toe(s) (acquired), left foot: Secondary | ICD-10-CM

## 2021-05-15 DIAGNOSIS — M21279 Flexion deformity, unspecified ankle and toes: Secondary | ICD-10-CM | POA: Diagnosis not present

## 2021-05-15 MED ORDER — MELOXICAM 15 MG PO TABS: 15.0000 mg | ORAL_TABLET | Freq: Every day | ORAL | 3 refills | Status: AC

## 2021-05-15 NOTE — Progress Notes (Signed)
  Subjective:  Patient ID: Betty Schmidt, female    DOB: [birth year],  MRN: [id number]  Chief Complaint  Patient presents with   Foot Pain    Np  right foot heel pain-painful to walk/ right great toe-unable to bend    50 y.o. female presents with the above complaint. History confirmed with patient.  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  She has 2 main issues plantar right heel pain which is been going on severely since a few weeks ago when they had to wear small flat shoes for work instead of her usual sneakers as per the heel pain to start.  Its worse in the morning she steps down.  She also complains of stiffness and pain in the big toe joint on the left foot.  Objective:  Physical Exam: warm, good capillary refill, no trophic changes or ulcerative lesions, normal DP and PT pulses, and normal sensory exam. Left Foot: First met elevatus and forefoot varus present with hypermobility of the first ray, she has a palpable dorsal spur and limited range of motion of the first MTPJ with pain on palpation Right Foot: First met elevatus and forefoot varus present with hypermobility of first ray, she has pain on palpation of the plantar medial calcaneal tubercle  No images are attached to the encounter.  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 Plantar fasciitis of right foot      Plan:  Patient was evaluated and treated and all questions answered.  Discussed the etiology and treatment options for plantar fasciitis including stretching, formal physical therapy, supportive shoegears such as a running shoe or sneaker, pre fabricated orthoses, injection therapy, and oral medications. We also discussed the role of surgical treatment of this for patients who do not improve after exhausting non-surgical  treatment options.   -XR reviewed with patient -Educated patient on stretching and icing of the affected limb -Plantar fascial brace dispensed -Injection delivered to the plantar fascia of the right foot. -Rx for meloxicam. Educated on use, risks and benefits of the medication  After sterile prep with povidone-iodine solution and alcohol, the right heel was injected with 0.5cc 2% xylocaine plain, 0.5cc 0.5% marcaine plain, $RemoveBeforeD'5mg'XyoaGPaVjxDrux$  triamcinolone acetonide, and $RemoveBefore'2mg'MDPOndNpuQpOy$  dexamethasone was injected along the medial plantar fascia at the insertion on the plantar calcaneus. The patient tolerated the procedure well without complication.  Reviewed the radiographs and explained the pathomechanics of forefoot elevatus and compensated forefoot varus how this contributes to hallux limitus and limitation of the range of motion of the first MTPJ.  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  Discussed treatment for this her range of motion does improve with plantarflexion of the ray and I discussed custom molded orthoses which she was casted for today with a deep heel cup longitudinal arch support horseshoe heel pad for the plantar fasciitis and a first metatarsal cut out of the first ray into the shell to plantar flex the first ray hopefully this will give her some relief and avoid surgical intervention.  I do think she may be a good candidate for a cheilectomy.  Could consider a plantarflexion osteotomy as well if not improving.  No follow-ups on file.

## 2021-06-17 ENCOUNTER — Encounter: Payer: Self-pay | Admitting: Podiatry

## 2021-06-17 ENCOUNTER — Ambulatory Visit: Payer: Federal, State, Local not specified - PPO | Admitting: Podiatry

## 2021-06-17 ENCOUNTER — Other Ambulatory Visit: Payer: Self-pay

## 2021-06-17 DIAGNOSIS — M21279 Flexion deformity, unspecified ankle and toes: Secondary | ICD-10-CM | POA: Diagnosis not present

## 2021-06-17 DIAGNOSIS — M722 Plantar fascial fibromatosis: Secondary | ICD-10-CM | POA: Diagnosis not present

## 2021-06-17 DIAGNOSIS — M205X2 Other deformities of toe(s) (acquired), left foot: Secondary | ICD-10-CM

## 2021-06-17 NOTE — Progress Notes (Signed)
  Subjective:  Patient ID: Betty Schmidt, female    DOB: [birth year],  MRN: [id number]  Chief Complaint  Patient presents with   Plantar Fasciitis    Right foot, 1 month follow up,  puo(orthotics in)    50 y.o. female presents with the above complaint. History confirmed with patient.  Overall doing better, says she has had about 40% relief since the last injection.  We did not give her the exercises last visit so she has not been able to do them but has been doing her own stretching on her own.  Has been taking meloxicam daily.  Objective:  Physical Exam: warm, good capillary refill, no trophic changes or ulcerative lesions, normal DP and PT pulses, and normal sensory exam. 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 Plantar fasciitis of right foot   2. Hallux limitus of left foot   3. Dorsiflexed first ray, unspecified laterality      Plan:  Patient was evaluated and treated and all questions answered.  Discussed the etiology and treatment options for plantar fasciitis including stretching, formal physical therapy, supportive shoegears such as a running shoe or sneaker, pre fabricated orthoses, injection therapy, and oral medications. We also discussed the role of surgical treatment of this for patients who do not improve after exhausting non-surgical treatment options.   -XR reviewed with patient -Educated patient on stretching and icing of the affected limb.   Home exercise plan dispensed -Repeat injection next visit if still quite painful -Continue as needed meloxicam. Educated on use, risks and benefits of the medication   Orthotics were available today for dispensing and I assessed them for form fit and function and she was pleased with them.  I reviewed the break-in period hopefully this will alleviate her hallux limitus that is mostly functional.  Could consider cheilectomy of the dorsal spur if not improving on the left foot.  Return in about 6 weeks (around 07/29/2021).

## 2021-06-17 NOTE — Patient Instructions (Signed)

## 2021-06-22 ENCOUNTER — Encounter: Payer: Self-pay | Admitting: Podiatry

## 2021-06-23 NOTE — Telephone Encounter (Signed)
Please call.

## 2021-07-29 ENCOUNTER — Ambulatory Visit: Payer: Federal, State, Local not specified - PPO | Admitting: Podiatry

## 2021-09-10 ENCOUNTER — Other Ambulatory Visit: Payer: Self-pay

## 2021-09-10 ENCOUNTER — Emergency Department (HOSPITAL_BASED_OUTPATIENT_CLINIC_OR_DEPARTMENT_OTHER)
Admission: EM | Admit: 2021-09-10 | Discharge: 2021-09-10 | Disposition: A | Discharge status: Home / Self Care | Payer: Federal, State, Local not specified - PPO | Attending: Emergency Medicine | Admitting: Emergency Medicine

## 2021-09-10 ENCOUNTER — Emergency Department (HOSPITAL_BASED_OUTPATIENT_CLINIC_OR_DEPARTMENT_OTHER): Payer: Federal, State, Local not specified - PPO | Admitting: Radiology

## 2021-09-10 DIAGNOSIS — M25551 Pain in right hip: Secondary | ICD-10-CM | POA: Insufficient documentation

## 2021-09-10 DIAGNOSIS — M62838 Other muscle spasm: Secondary | ICD-10-CM | POA: Insufficient documentation

## 2021-09-10 DIAGNOSIS — R519 Headache, unspecified: Secondary | ICD-10-CM | POA: Diagnosis not present

## 2021-09-10 DIAGNOSIS — M25512 Pain in left shoulder: Secondary | ICD-10-CM | POA: Diagnosis not present

## 2021-09-10 DIAGNOSIS — M25511 Pain in right shoulder: Secondary | ICD-10-CM | POA: Insufficient documentation

## 2021-09-10 DIAGNOSIS — Y92410 Unspecified street and highway as the place of occurrence of the external cause: Secondary | ICD-10-CM | POA: Diagnosis not present

## 2021-09-10 DIAGNOSIS — I10 Essential (primary) hypertension: Secondary | ICD-10-CM | POA: Insufficient documentation

## 2021-09-10 DIAGNOSIS — M542 Cervicalgia: Secondary | ICD-10-CM | POA: Diagnosis present

## 2021-09-10 MED ORDER — CYCLOBENZAPRINE HCL 10 MG PO TABS: 10.0000 mg | ORAL_TABLET | Freq: Two times a day (BID) | ORAL | 0 refills | Status: AC | PRN

## 2021-09-10 MED ORDER — LIDOCAINE 5 % EX PTCH: 1.0000 | MEDICATED_PATCH | CUTANEOUS | 0 refills | Status: AC
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
## 2021-09-15 ENCOUNTER — Telehealth (HOSPITAL_BASED_OUTPATIENT_CLINIC_OR_DEPARTMENT_OTHER): Payer: Self-pay | Admitting: Emergency Medicine

## 2023-06-21 NOTE — Telephone Encounter (Signed)
Closing previous encounter  Denyce Robert, NP
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
